# Patient Record
Sex: Female | Born: 1970 | Hispanic: Yes | Marital: Married | Smoking: Never smoker
Health system: Southern US, Community
[De-identification: ages and names within clinical notes are randomized; demographics above are authoritative.]

## PROBLEM LIST (undated history)

## (undated) DIAGNOSIS — I10 Essential (primary) hypertension: Secondary | ICD-10-CM

## (undated) DIAGNOSIS — J45909 Unspecified asthma, uncomplicated: Secondary | ICD-10-CM

## (undated) DIAGNOSIS — K219 Gastro-esophageal reflux disease without esophagitis: Secondary | ICD-10-CM

## (undated) DIAGNOSIS — E785 Hyperlipidemia, unspecified: Secondary | ICD-10-CM

## (undated) HISTORY — DX: Unspecified asthma, uncomplicated: J45.909

## (undated) HISTORY — DX: Gastro-esophageal reflux disease without esophagitis: K21.9

## (undated) HISTORY — DX: Hyperlipidemia, unspecified: E78.5

## (undated) HISTORY — DX: Essential (primary) hypertension: I10

---

## 2012-03-28 ENCOUNTER — Other Ambulatory Visit (HOSPITAL_COMMUNITY): Payer: Self-pay | Admitting: *Deleted

## 2012-03-28 DIAGNOSIS — Z1231 Encounter for screening mammogram for malignant neoplasm of breast: Secondary | ICD-10-CM

## 2012-05-05 ENCOUNTER — Ambulatory Visit (HOSPITAL_COMMUNITY): Payer: Self-pay

## 2012-05-29 ENCOUNTER — Ambulatory Visit (HOSPITAL_COMMUNITY)
Admission: RE | Admit: 2012-05-29 | Discharge: 2012-05-29 | Disposition: A | Discharge status: Home / Self Care | Payer: Self-pay | Source: Ambulatory Visit | Attending: *Deleted | Admitting: *Deleted

## 2012-05-29 DIAGNOSIS — Z1231 Encounter for screening mammogram for malignant neoplasm of breast: Secondary | ICD-10-CM

## 2012-05-29 IMAGING — MG MM DIGITAL SCREENING BILAT W/ CAD
5 series · 5 of 5 positions shown · non-contrast
Comparison: none

CLINICAL DATA: Screening.

MAMMOGRAPHIC BILATERAL DIGITAL SCREENING WITH CAD

[R CC]
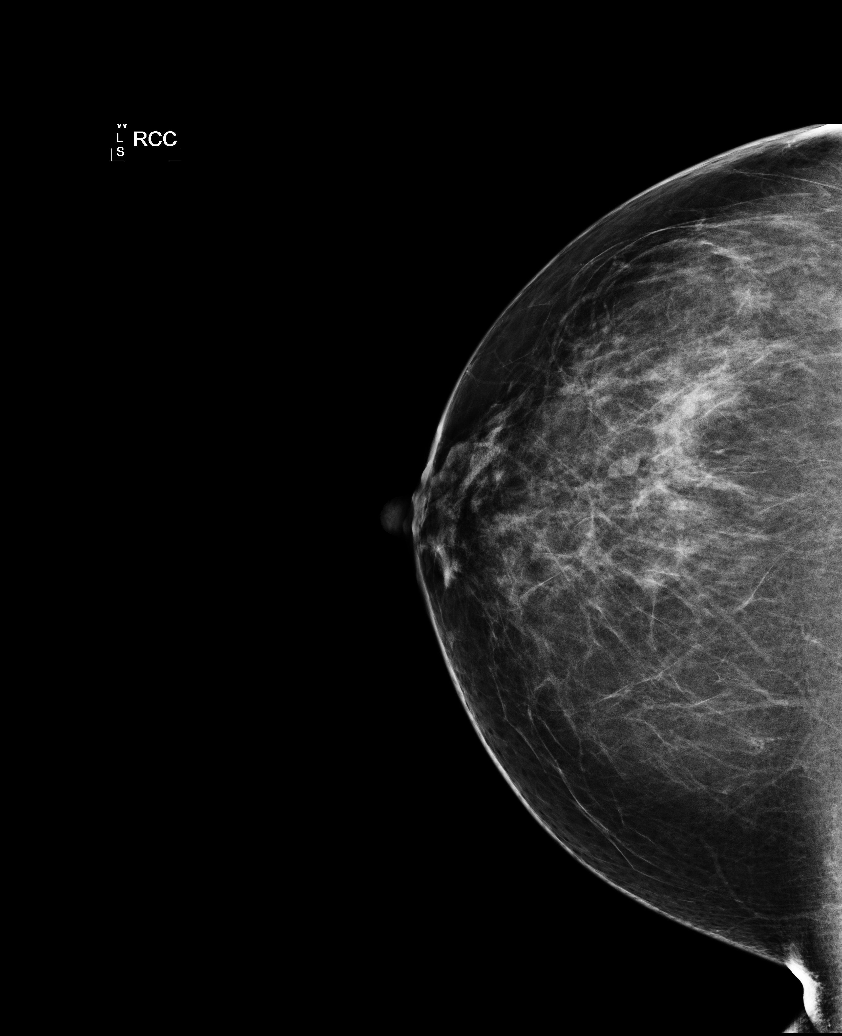

[R MLO]
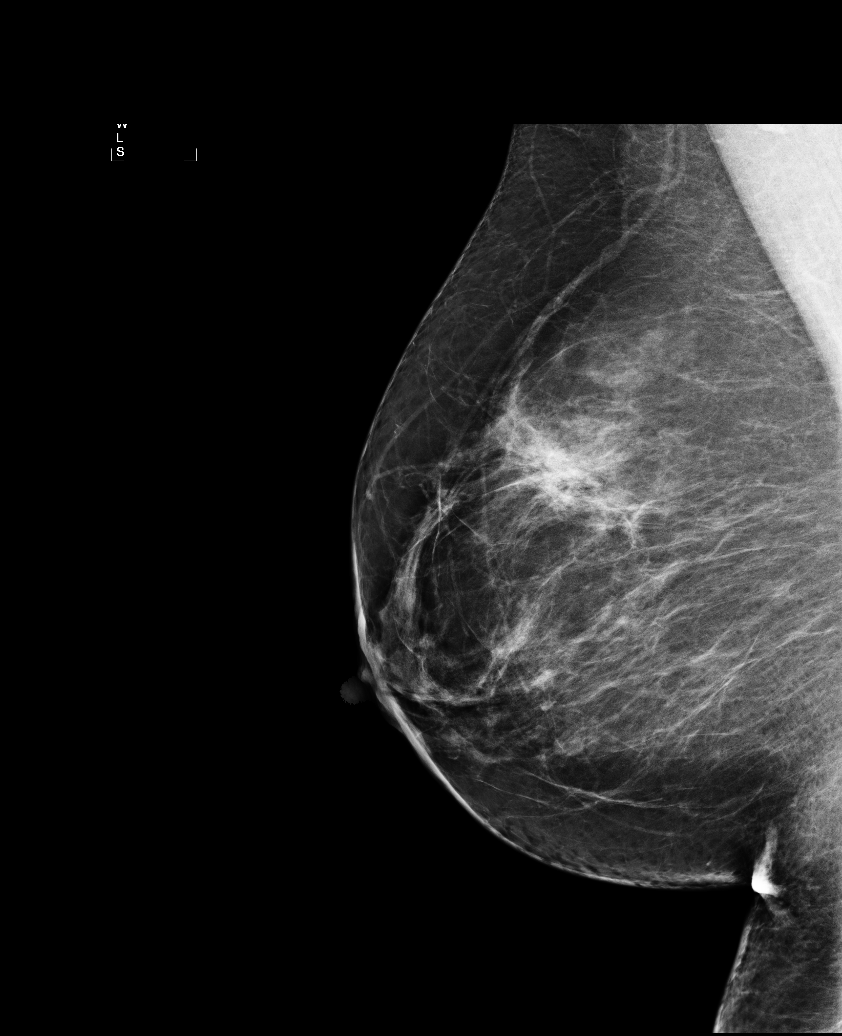

[L CC (1 of 2)]
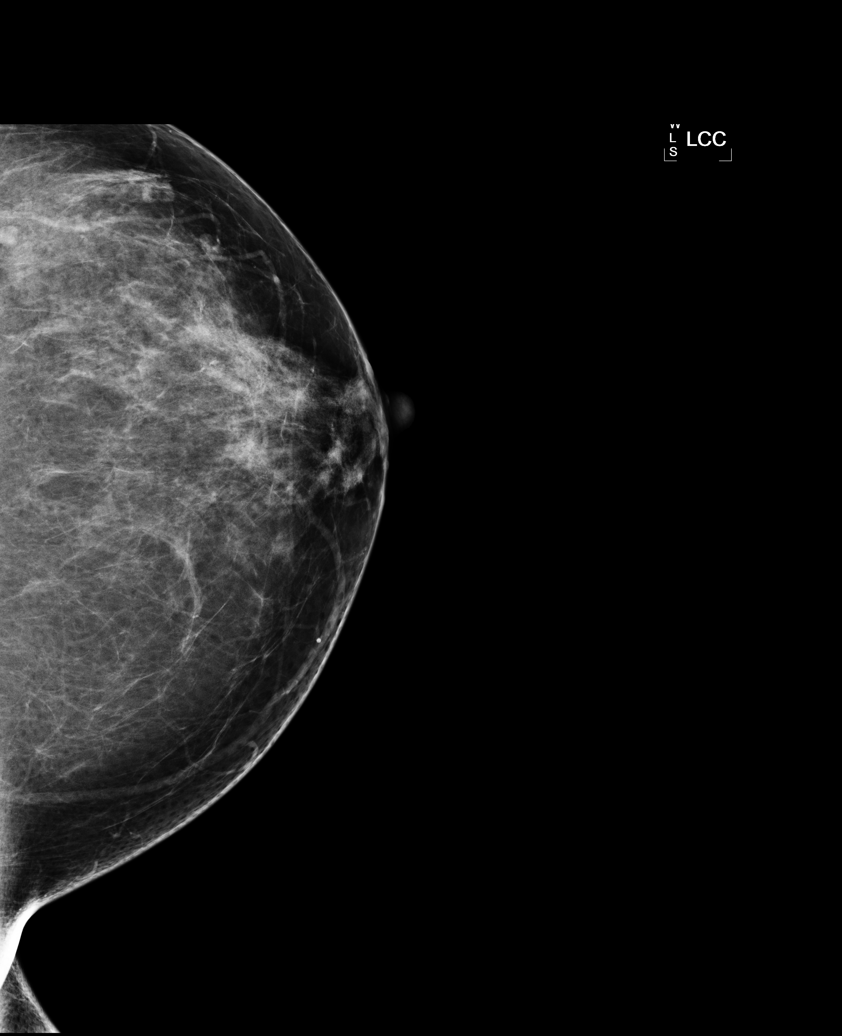

[L MLO]
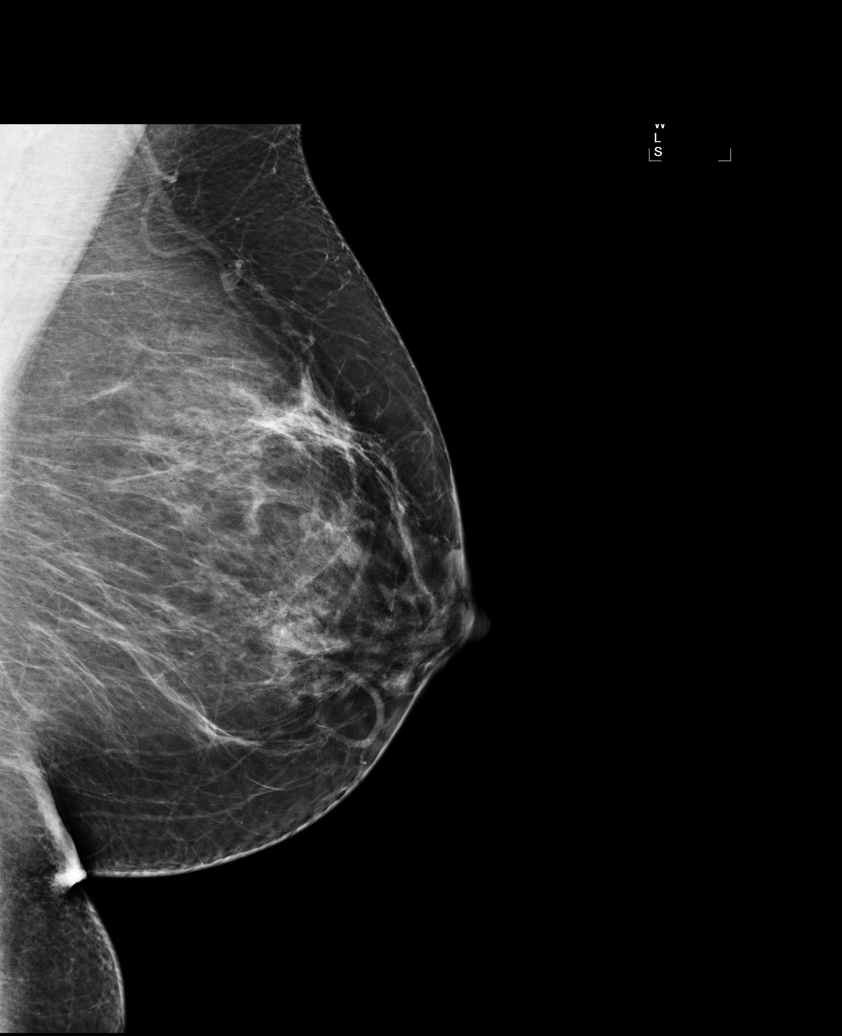

[L CC (2 of 2)]
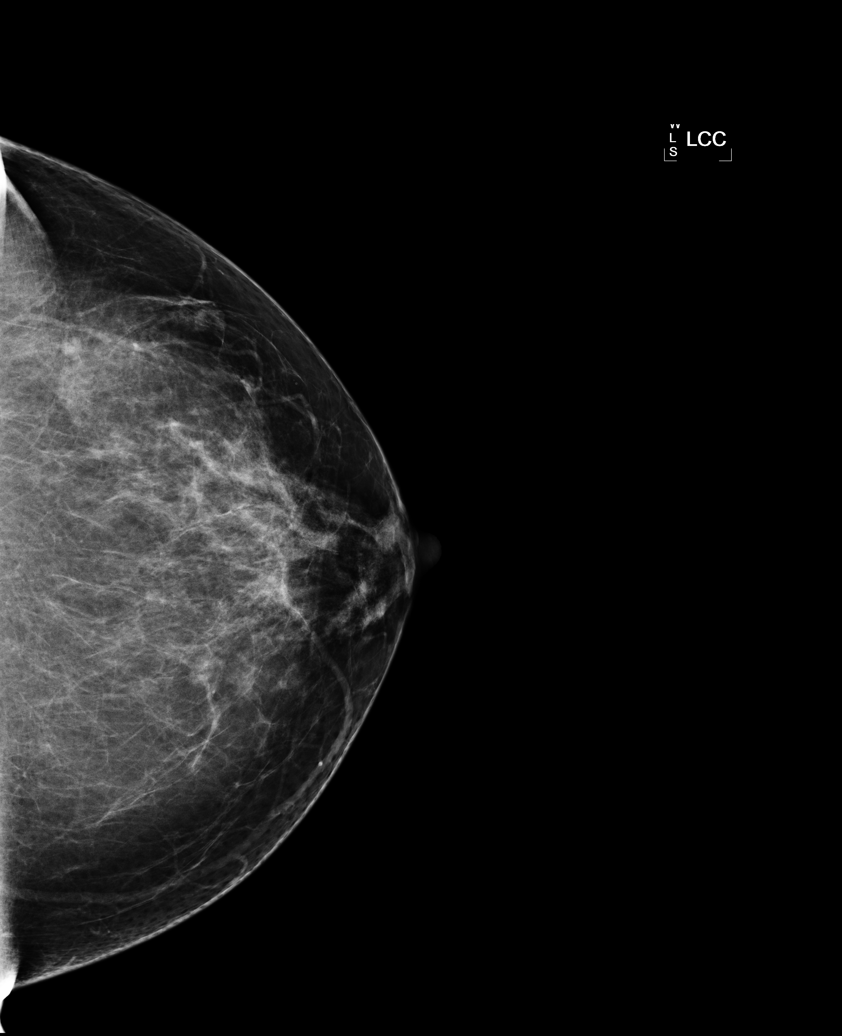

[5 of 5 positions shown; findings below may reference images not displayed]

FINDINGS: Two views of each breast demonstrate scattered
fibroglandular densities. In the right breast, a possible asymmetry
warrants further evaluation with spot compression views and
possibly ultrasound.  In the left breast, no asymmetries or
malignant type calcifications are identified.

Images were processed with CAD.
IMPRESSION: Further evaluation is suggested for possible asymmetry in the right
breast.

RECOMMENDATION:
Diagnostic mammogram and possibly ultrasound of the right breast.
(Code:S2-7-77I)

BI-RADS CATEGORY 0:  Incomplete.  Need additional imaging
evaluation and/or prior mammograms for comparison.

## 2012-06-04 ENCOUNTER — Other Ambulatory Visit: Payer: Self-pay | Admitting: *Deleted

## 2012-06-04 ENCOUNTER — Other Ambulatory Visit: Payer: Self-pay | Admitting: Unknown Physician Specialty

## 2012-06-04 DIAGNOSIS — R928 Other abnormal and inconclusive findings on diagnostic imaging of breast: Secondary | ICD-10-CM

## 2012-06-11 ENCOUNTER — Ambulatory Visit
Admission: RE | Admit: 2012-06-11 | Discharge: 2012-06-11 | Disposition: A | Discharge status: Home / Self Care | Payer: No Typology Code available for payment source | Source: Ambulatory Visit | Attending: *Deleted | Admitting: *Deleted

## 2012-06-11 DIAGNOSIS — R928 Other abnormal and inconclusive findings on diagnostic imaging of breast: Secondary | ICD-10-CM

## 2012-06-11 IMAGING — US US BREAST*R*
1 series · 4 of 4 positions shown · non-contrast
Comparison: None.

CLINICAL DATA: The patient returns for evaluation of a possible
asymmetry in the right breast noted on recent screening mammogram
dated 05/29/2012.

DIGITAL DIAGNOSTIC RIGHT LIMITED MAMMOGRAM  AND RIGHT BREAST
ULTRASOUND:

[Series 1: us breast*right* · 4 of 4 slices shown]
[im 1/4]
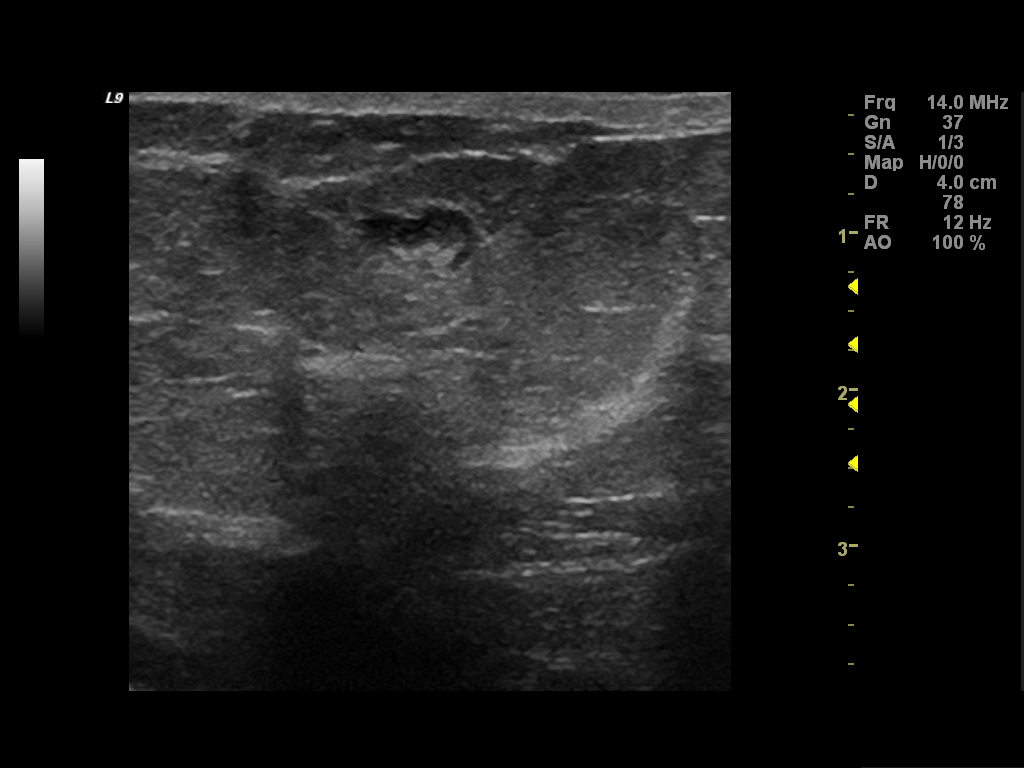
[im 2/4]
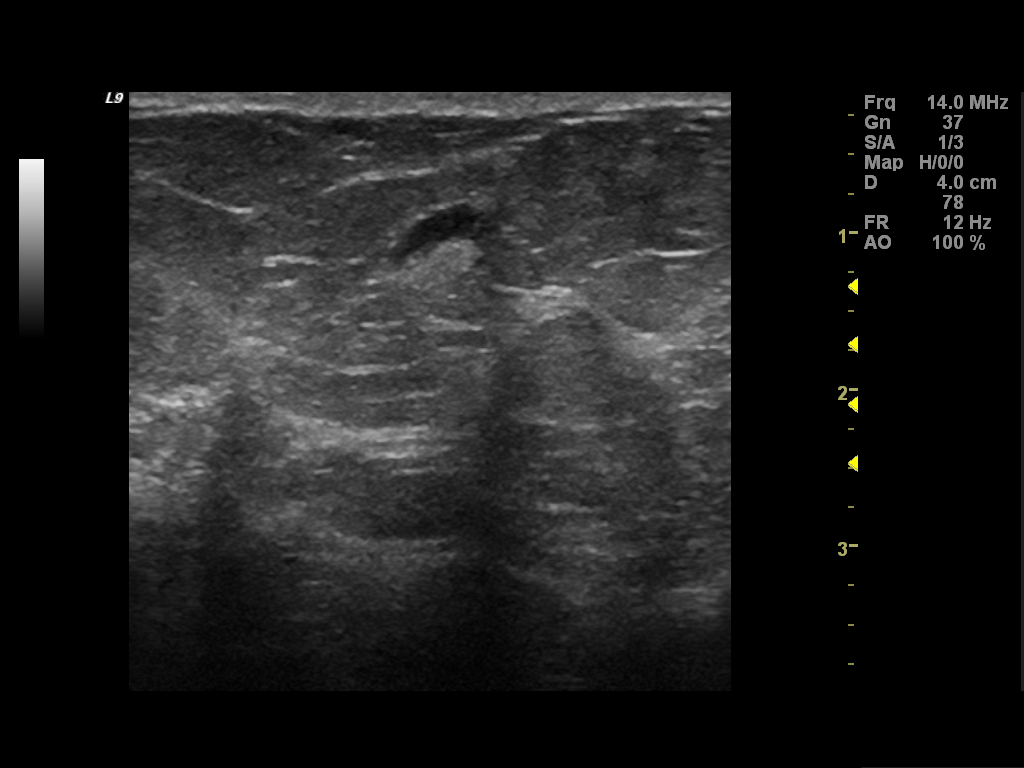
[im 3/4]
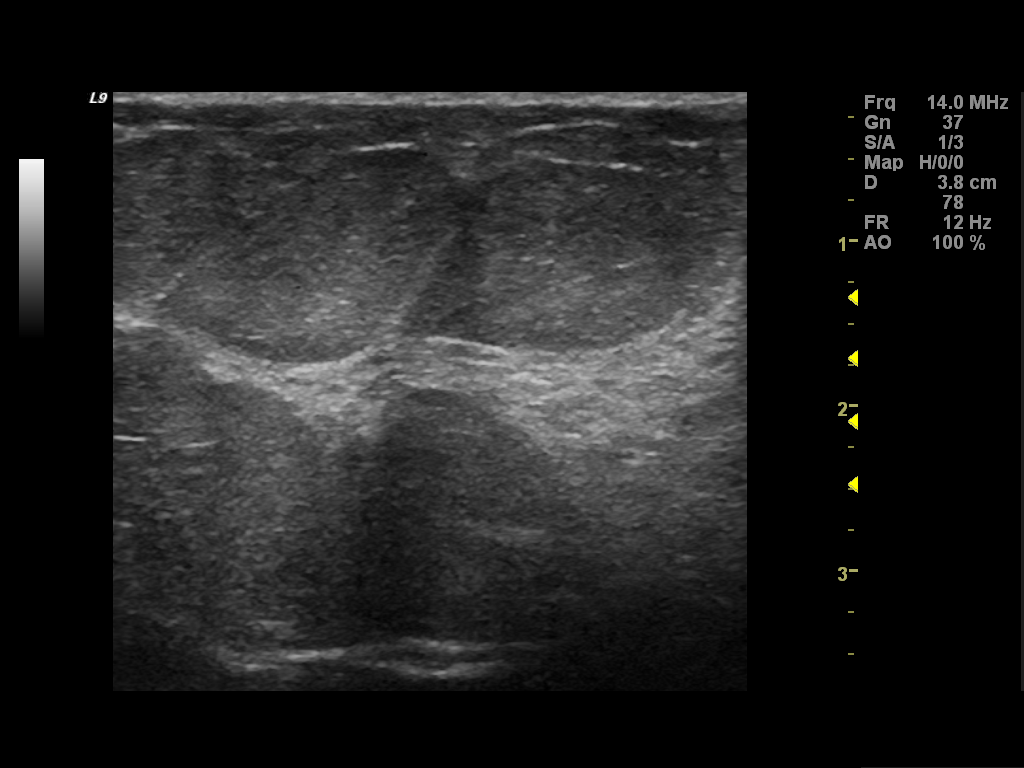
[im 4/4]
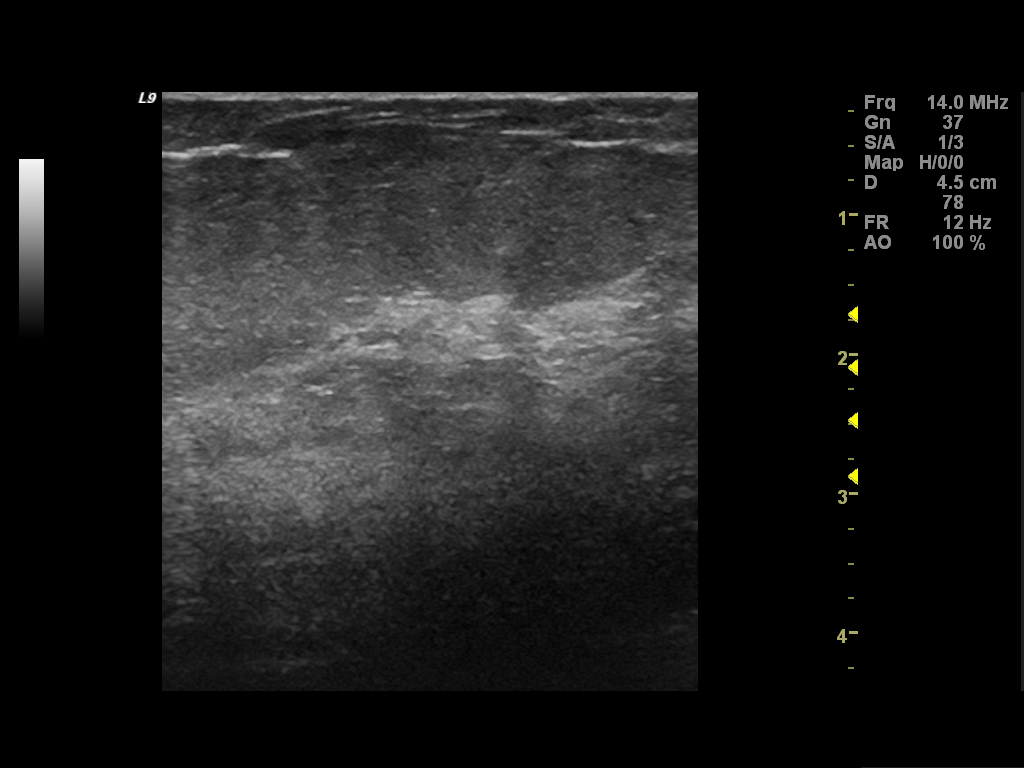

[4 of 4 positions shown; findings below may reference images not displayed]

FINDINGS: Additional views the right upper outer quadrant
demonstrate no persistent mass or distortion.  Additional views of
the right lower outer quadrant demonstrate a circumscribed nodule
consistent with a lymph node.
Mammographic images were processed with CAD.

On physical exam, no mass is palpated in the outer half of the
right breast.

Ultrasound is performed, showing an intramammary lymph node at 7
o'clock, 6 cm from the right nipple.  In the right upper outer
quadrant there is mixed fibroglandular tissue and fat with no mass,
distortion or shadowing to suggest malignancy.
IMPRESSION: No mammographic or sonographic evidence of malignancy.

RECOMMENDATION:
Yearly screening mammography is suggested.

BI-RADS CATEGORY 2:  Benign finding(s).

## 2012-06-12 IMAGING — MG MM DIGITAL DIAGNOSTIC LIMITED*R*
4 series · 4 of 4 positions shown · non-contrast
Comparison: None.

CLINICAL DATA: The patient returns for evaluation of a possible
asymmetry in the right breast noted on recent screening mammogram
dated 05/29/2012.

DIGITAL DIAGNOSTIC RIGHT LIMITED MAMMOGRAM  AND RIGHT BREAST
ULTRASOUND:

[R CC]
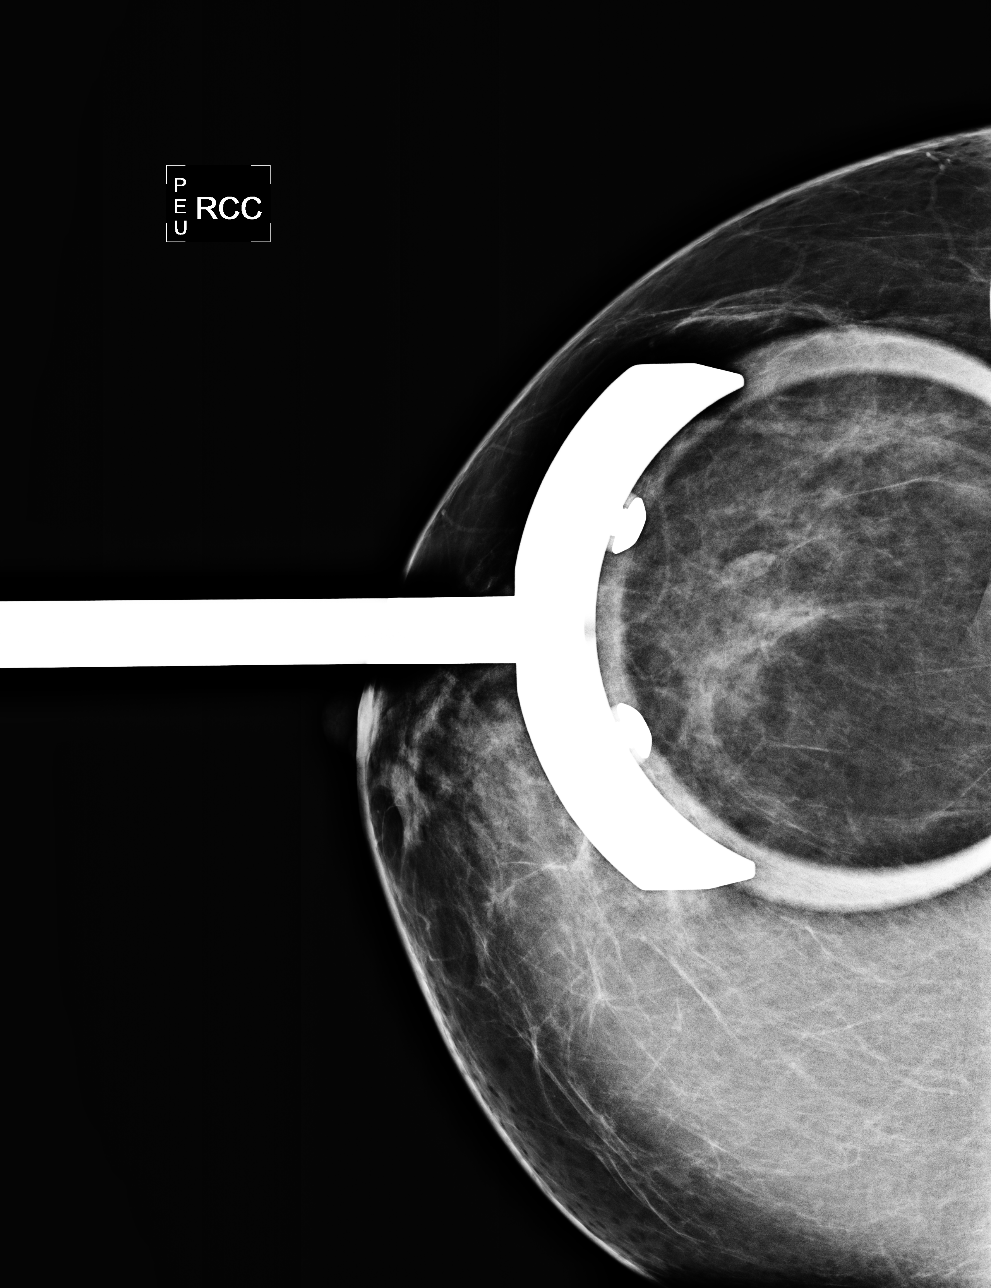

[R MLO (1 of 3)]
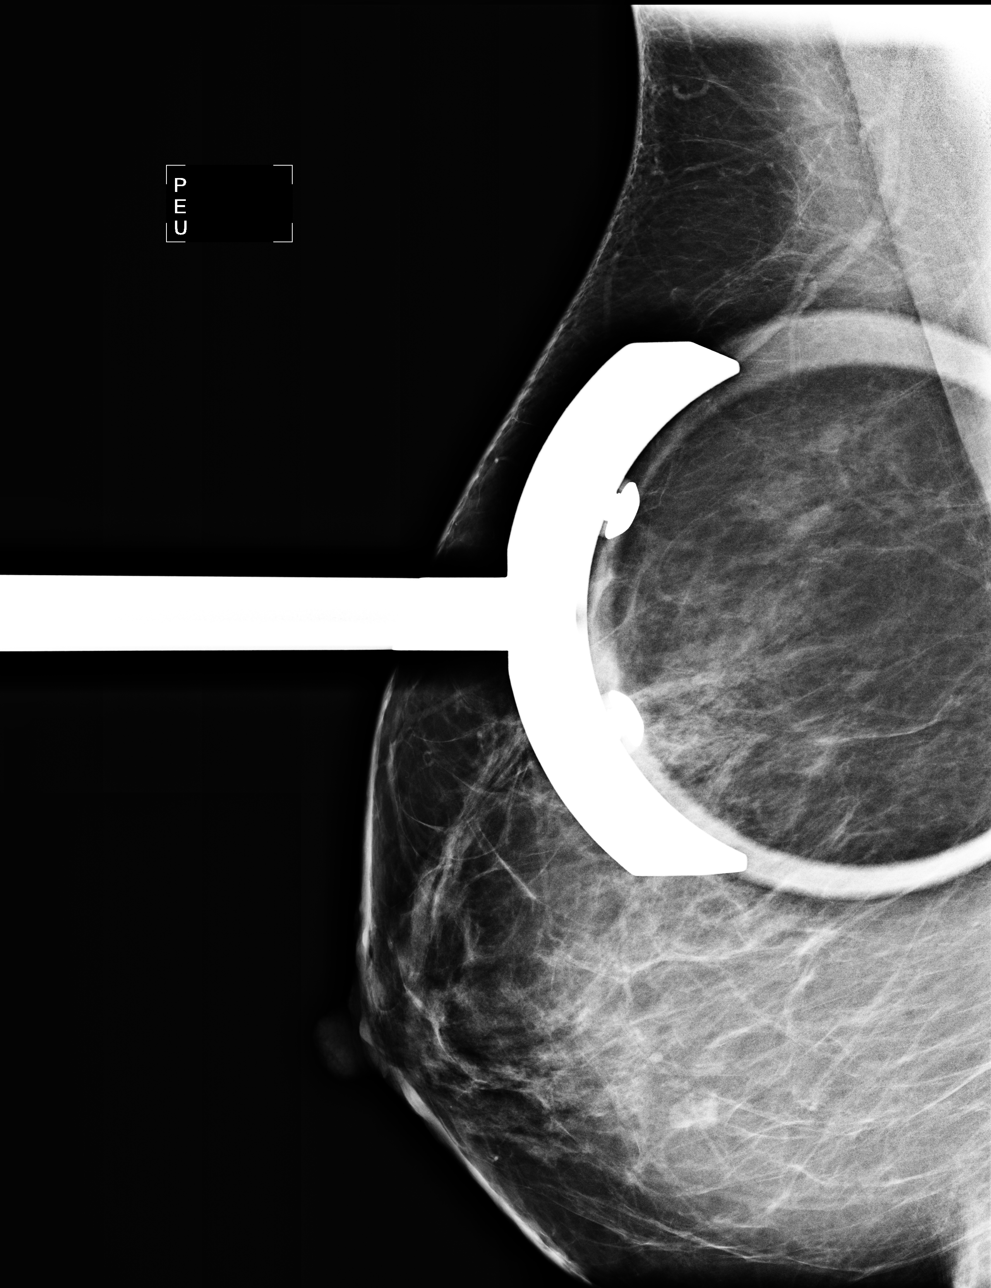

[R MLO (2 of 3)]
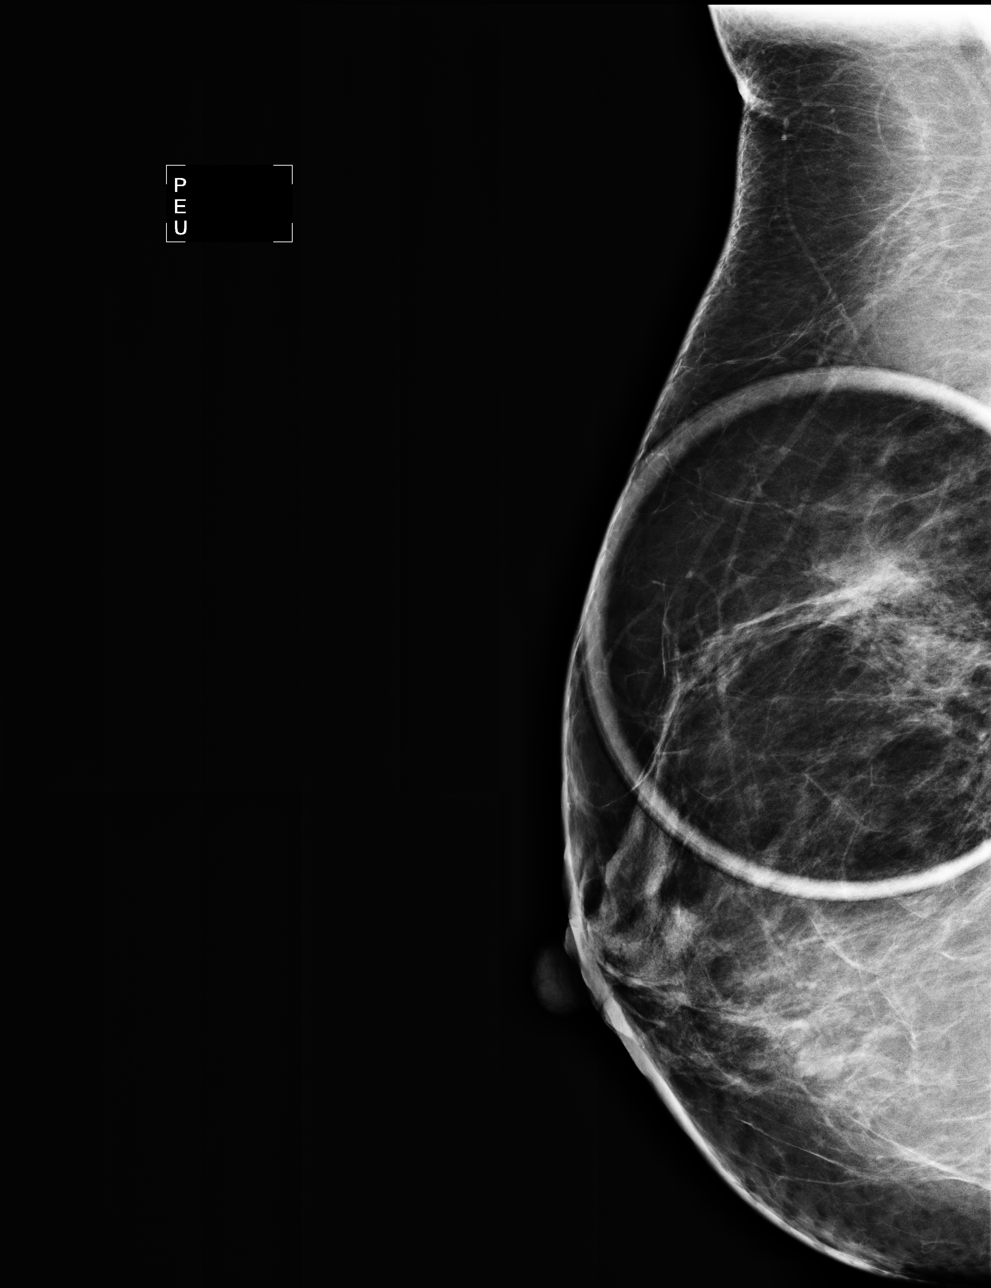

[R MLO (3 of 3)]
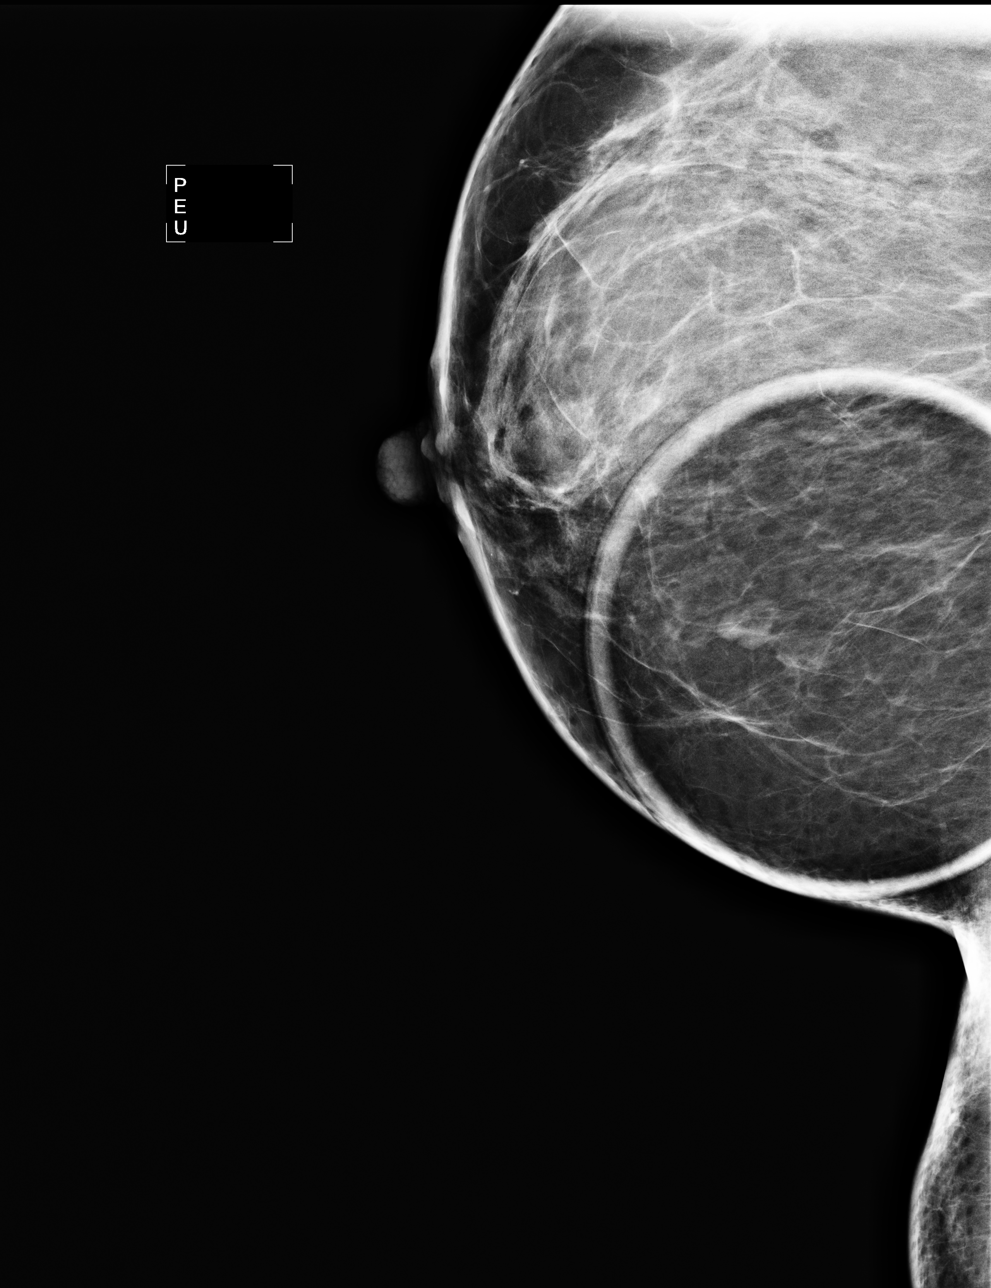

[4 of 4 positions shown; findings below may reference images not displayed]

FINDINGS: Additional views the right upper outer quadrant
demonstrate no persistent mass or distortion.  Additional views of
the right lower outer quadrant demonstrate a circumscribed nodule
consistent with a lymph node.
Mammographic images were processed with CAD.

On physical exam, no mass is palpated in the outer half of the
right breast.

Ultrasound is performed, showing an intramammary lymph node at 7
o'clock, 6 cm from the right nipple.  In the right upper outer
quadrant there is mixed fibroglandular tissue and fat with no mass,
distortion or shadowing to suggest malignancy.
IMPRESSION: No mammographic or sonographic evidence of malignancy.

RECOMMENDATION:
Yearly screening mammography is suggested.

BI-RADS CATEGORY 2:  Benign finding(s).

## 2013-06-10 ENCOUNTER — Other Ambulatory Visit (HOSPITAL_COMMUNITY): Payer: Self-pay | Admitting: Unknown Physician Specialty

## 2013-06-10 DIAGNOSIS — Z1231 Encounter for screening mammogram for malignant neoplasm of breast: Secondary | ICD-10-CM

## 2013-06-18 ENCOUNTER — Ambulatory Visit (HOSPITAL_COMMUNITY)
Admission: RE | Admit: 2013-06-18 | Discharge: 2013-06-18 | Disposition: A | Discharge status: Home / Self Care | Payer: Self-pay | Source: Ambulatory Visit | Attending: Unknown Physician Specialty | Admitting: Unknown Physician Specialty

## 2013-06-18 DIAGNOSIS — Z1231 Encounter for screening mammogram for malignant neoplasm of breast: Secondary | ICD-10-CM

## 2013-06-18 IMAGING — MG MM DIGITAL SCREENING BILAT W/ CAD
4 series · 4 of 4 positions shown · non-contrast
Comparison: 05/29/2012

CLINICAL DATA: Screening.

DIGITAL SCREENING BILATERAL MAMMOGRAM WITH CAD

[R CC]
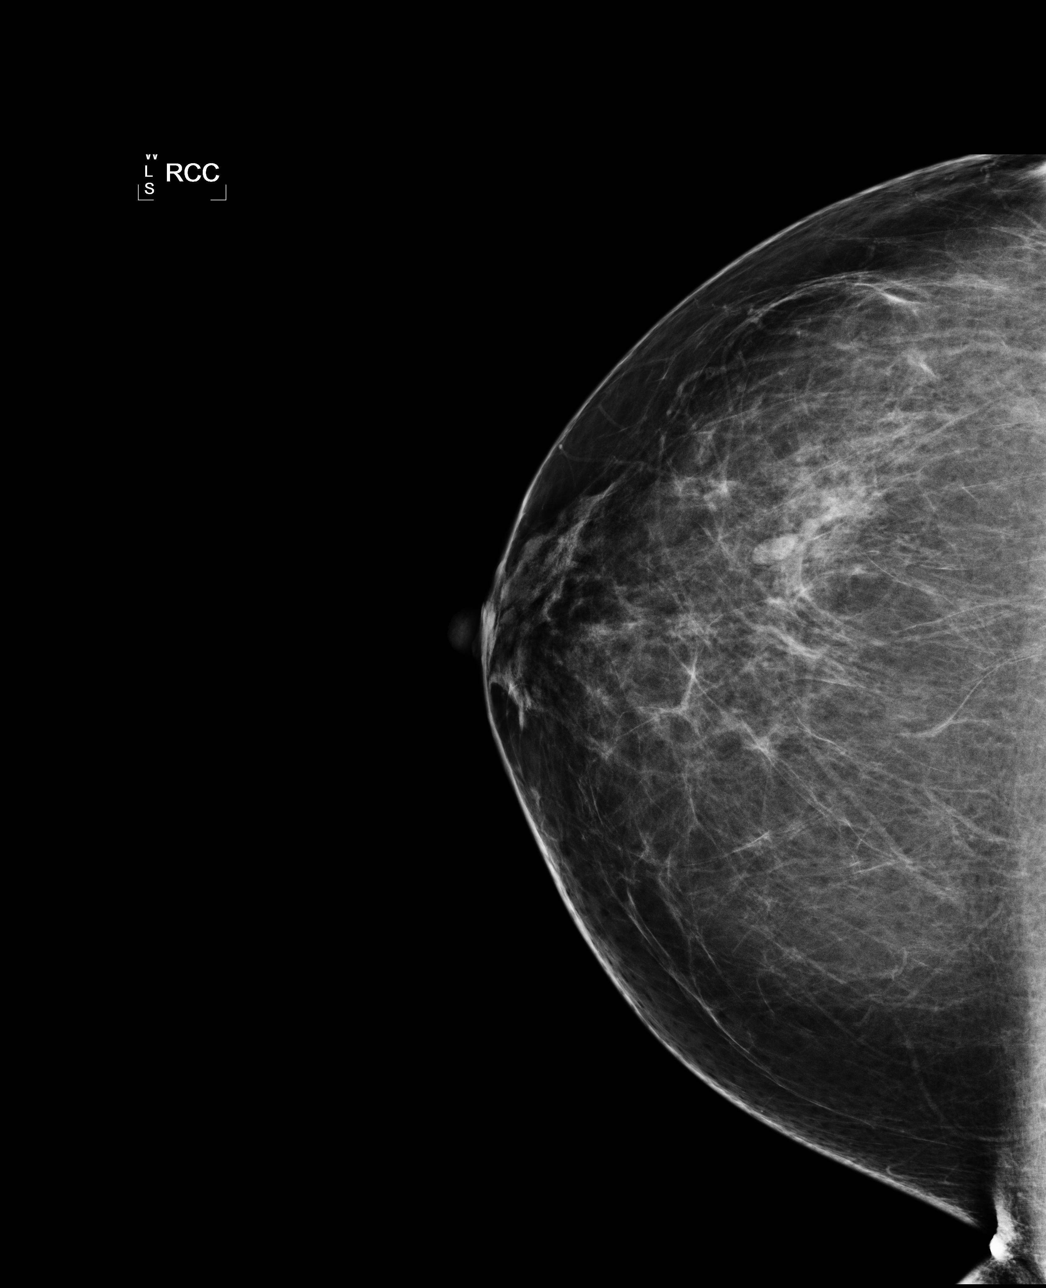

[R MLO]
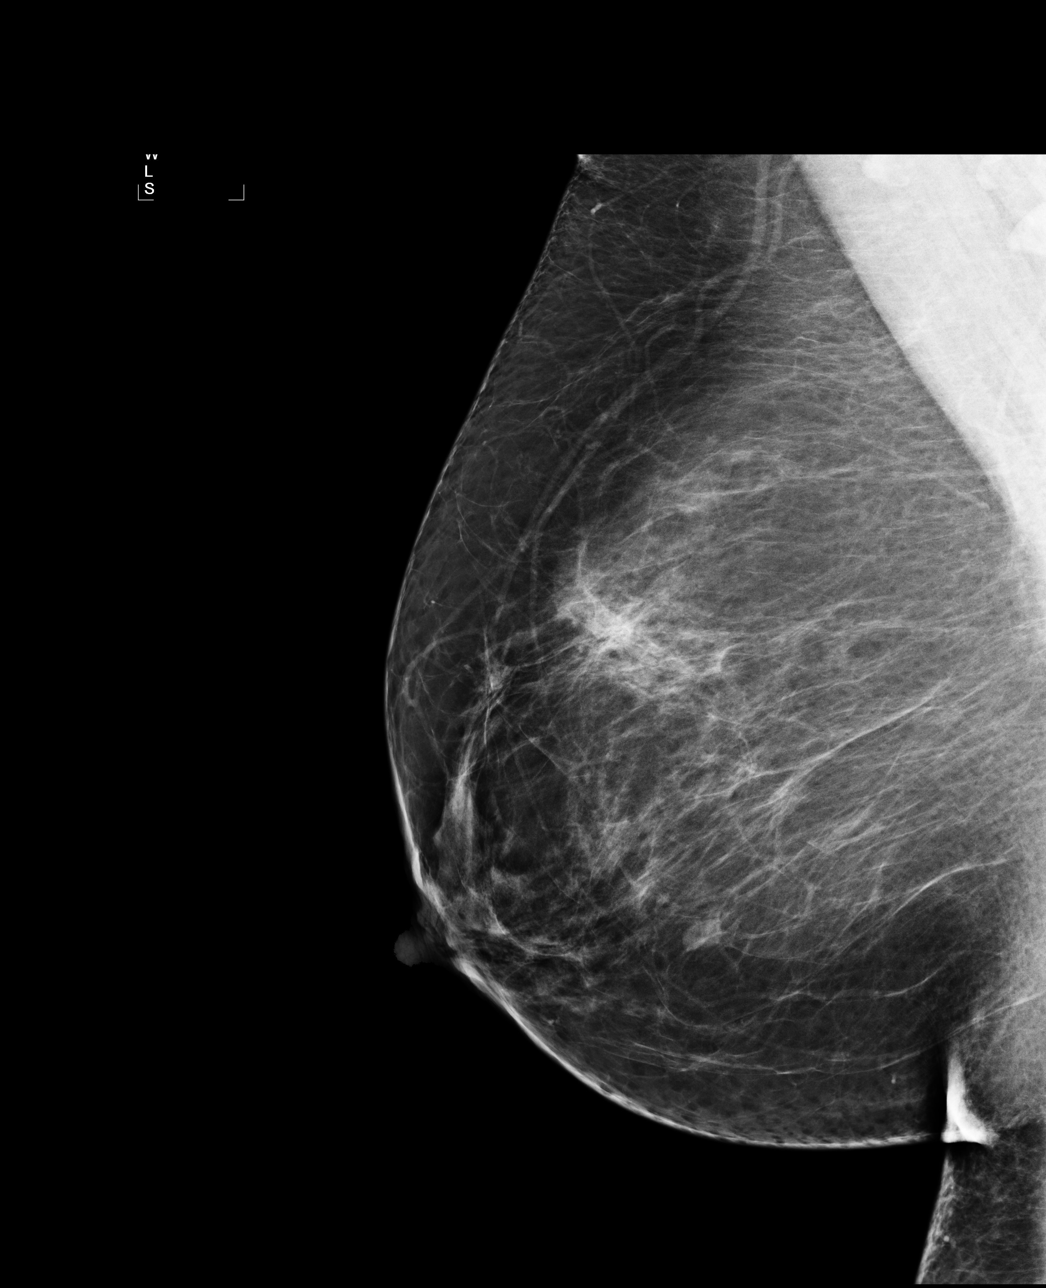

[L CC]
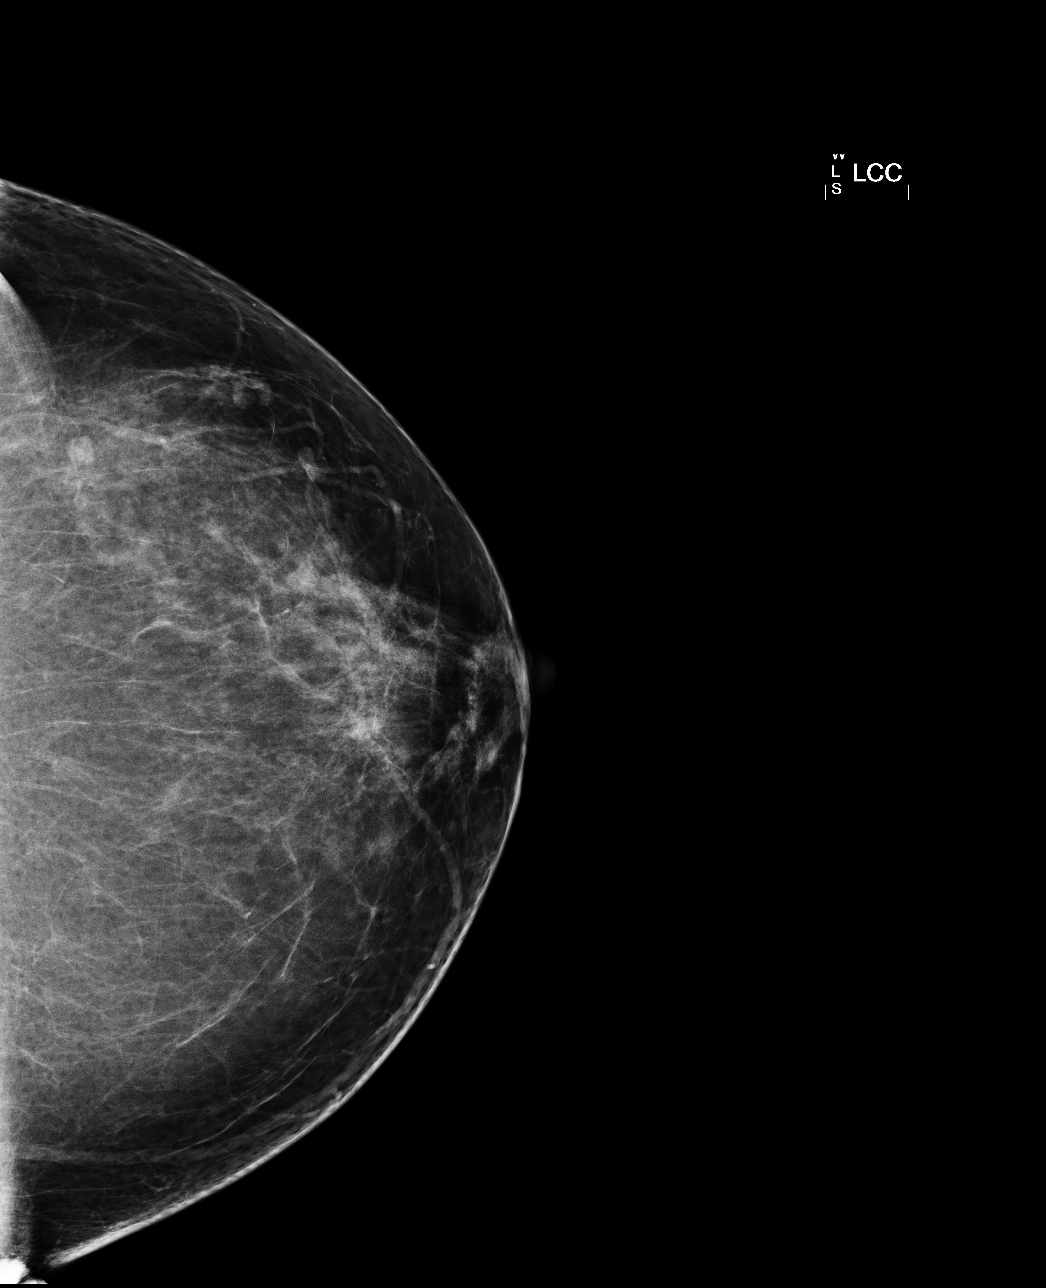

[L MLO]
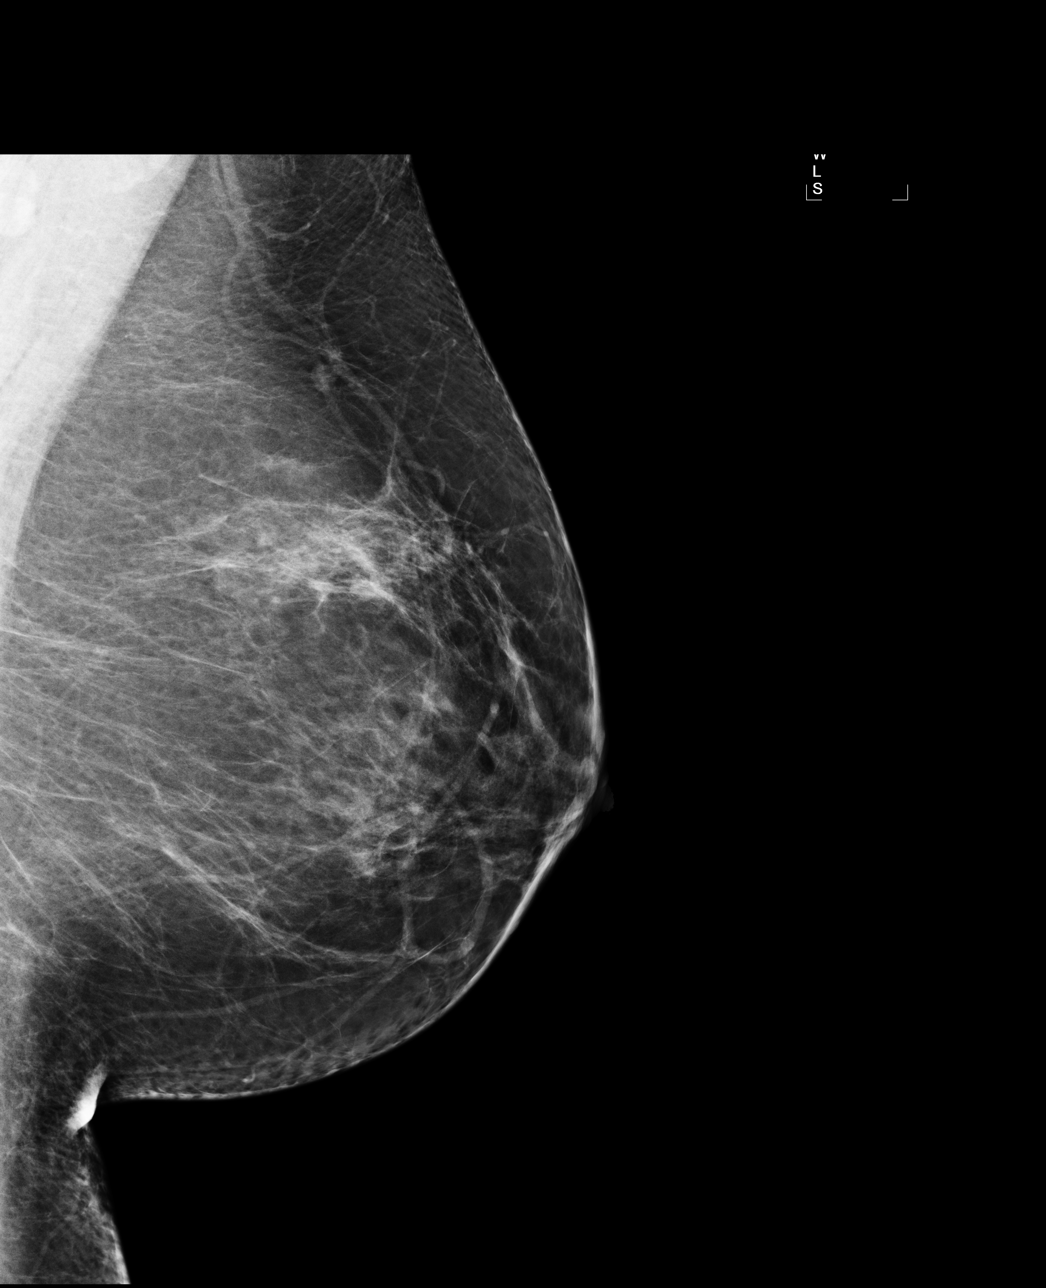

[4 of 4 positions shown; findings below may reference images not displayed]

FINDINGS: ACR Breast Density Category b: There are scattered areas of
fibroglandular density.

There is no suspicious dominant mass, architectural distortion, or
calcification to suggest malignancy.

Images were processed with CAD.
IMPRESSION: No mammographic evidence of malignancy.

A result letter of this screening mammogram will be mailed directly
to the patient.

RECOMMENDATION:
Screening mammogram in one year. (Code:EV-S-TAV)

BI-RADS CATEGORY 1:  Negative.

## 2013-11-26 ENCOUNTER — Encounter: Payer: Self-pay | Admitting: *Deleted

## 2013-12-28 ENCOUNTER — Encounter: Payer: No Typology Code available for payment source | Admitting: Obstetrics & Gynecology

## 2014-02-01 ENCOUNTER — Ambulatory Visit (INDEPENDENT_AMBULATORY_CARE_PROVIDER_SITE_OTHER): Payer: Self-pay | Admitting: Obstetrics & Gynecology

## 2014-02-01 ENCOUNTER — Encounter: Payer: Self-pay | Admitting: Obstetrics & Gynecology

## 2014-02-01 VITALS — BP 130/91 | HR 83 | Temp 97.5°F | Ht 62.0 in | Wt 223.6 lb

## 2014-02-01 DIAGNOSIS — N898 Other specified noninflammatory disorders of vagina: Secondary | ICD-10-CM

## 2014-02-01 NOTE — Progress Notes (Signed)
Subjective:     Patient ID: Robin Webb, female   DOB: 15-Jun-1971, 43 y.o.   MRN: [id number]  HPI Pt presents as a referral from the [facility] for eval of vaginal wall cyst.  She denies complaints or other sx.  She is not sexually active often but, reports that it is not related to the cyst.  Past Medical History  Diagnosis Date  . Hyperlipidemia   . Hypertension   . GERD (gastroesophageal reflux disease)   . Asthma   History reviewed. No pertinent past surgical history. No current outpatient prescriptions on file prior to visit.   No current facility-administered medications on file prior to visit.   No Known Allergies Family History  Problem Relation Age of Onset  . Cancer Father   . Diabetes Brother    History  Substance Use Topics  . Smoking status: Never Smoker   . Smokeless tobacco: Never Used  . Alcohol Use: No     Review of Systems     Objective:   Physical Exam BP 130/91  Pulse 83  Temp(Src) 97.5 F (36.4 C) (Oral)  Ht 5\' 2"  (1.575 m)  Wt 223 lb 9.6 oz (101.424 kg)  BMI 40.89 kg/m2  LMP 12/26/2013 GU: EGBUS: no lesions Vagina: no blood in vault; simple cyst on left vaginal sidewall- benign. Cervix: no lesion; no mucopurulent d/c Uterus: small, mobile Adnexa: no masses; non tender         Assessment:     Vaginal wall cyst- benign and asymptomatic     Plan:     F/u prn

## 2014-02-01 NOTE — Patient Instructions (Signed)
Quiste epidérmico   (Epidermal Cyst)  Un quiste epidérmico se denomina también quiste sebáceo, quiste de inclusión epidérmica o quiste infundibular. Estos quistes contienen una sustancia "pastosa" o similar al "queso" y puede tener mal olor. Esta sustancia es una proteína denominada keratina. Estos quistes generalmente se forman en el rostro, el cuello o el tronco. También pueden aparecer en la zona vaginal u otras partes de los genitales, tanto en hombres como en mujeres. En general son pequeños bultos indoloros, que crecen lentamente y que se mueven libremente debajo de la piel. Es importante no tratar de apretarlos para extraer la sustancia que contienen. Esto puede ocasionar una infección que origine dolor e hinchazón en el área.   CAUSAS   La causa del puede ser una lesión penetrante profunda o un folículo piloso obstruido, generalmente asociado al acné.   SÍNTOMAS   Los quistes epidermicos pueden inflamarse y causar:   · Enrojecimiento.  · Sensibilidad.  · Aumento de la temperatura en la zona.  · Material que drena de color blanco grisáceo, consistente y de olor desagradable.  DIAGNÓSTICO  Generalmente estas infecciones son diagnosticadas por el profesional durante el examen físico. En raras ocasiones será necesario realizar una biopsia para descartar otros trastornos que parezcan ser similares.   TRATAMIENTO  · Generalmente mejoran y desaparecen sin tratamiento. No suelen ser peligrosos.  · Pueden inflamarse y sensibilizarse si se infectan. Esto puede requerir la apertura y drenaje del quiste. Podrá ser necesaria la administración de antibióticos. Cuando la infección haya desaparecido, el quiste podrá eliminarse con una cirugía menor.  · Los pequeños quistes inflamados generalmente pueden tratarse inyectado corticoides con los antibióticos.  · En algunos casos el quiste se agranda y puede ser una preocupación. Si esto ocurre, es necesario extirparlo quirúrgicamente en el consultorio del  profesional.  INSTRUCCIONES PARA EL CUIDADO EN EL HOGAR   · Tome sólo medicamentos de venta libre o recetados, según las indicaciones del médico.  · Tome los antibióticos como se le indicó. Tómelos todos, aunque se sienta mejor.  SOLICITE ATENCIÓN MÉDICA SI:   · Siente dolor, observa enrojecimiento o hinchazón.  · El problema no mejora, o empeora.  · Tiene preguntas o preocupaciones.  ASEGÚRESE DE QUE:   · Comprende estas instrucciones.  · Controlará su enfermedad.  · Solicitará ayuda de inmediato si no mejora o si empeora.  Document Released: 01/21/2007 Document Revised: 03/03/2012  [employer]® Patient Information ©2014 [employer].

## 2014-06-01 ENCOUNTER — Other Ambulatory Visit (HOSPITAL_COMMUNITY): Payer: Self-pay | Admitting: Internal Medicine

## 2014-06-01 DIAGNOSIS — Z1231 Encounter for screening mammogram for malignant neoplasm of breast: Secondary | ICD-10-CM

## 2014-06-21 ENCOUNTER — Ambulatory Visit (HOSPITAL_COMMUNITY)
Admission: RE | Admit: 2014-06-21 | Discharge: 2014-06-21 | Disposition: A | Discharge status: Home / Self Care | Payer: Self-pay | Source: Ambulatory Visit | Attending: Internal Medicine | Admitting: Internal Medicine

## 2014-06-21 DIAGNOSIS — Z1231 Encounter for screening mammogram for malignant neoplasm of breast: Secondary | ICD-10-CM

## 2014-06-21 IMAGING — MG MM DIGITAL SCREENING BILAT W/ CAD
5 series · 5 of 5 positions shown · non-contrast
Comparison: Previous exam(s).

CLINICAL DATA: Screening.

EXAM:
DIGITAL SCREENING BILATERAL MAMMOGRAM WITH CAD

[L CC]
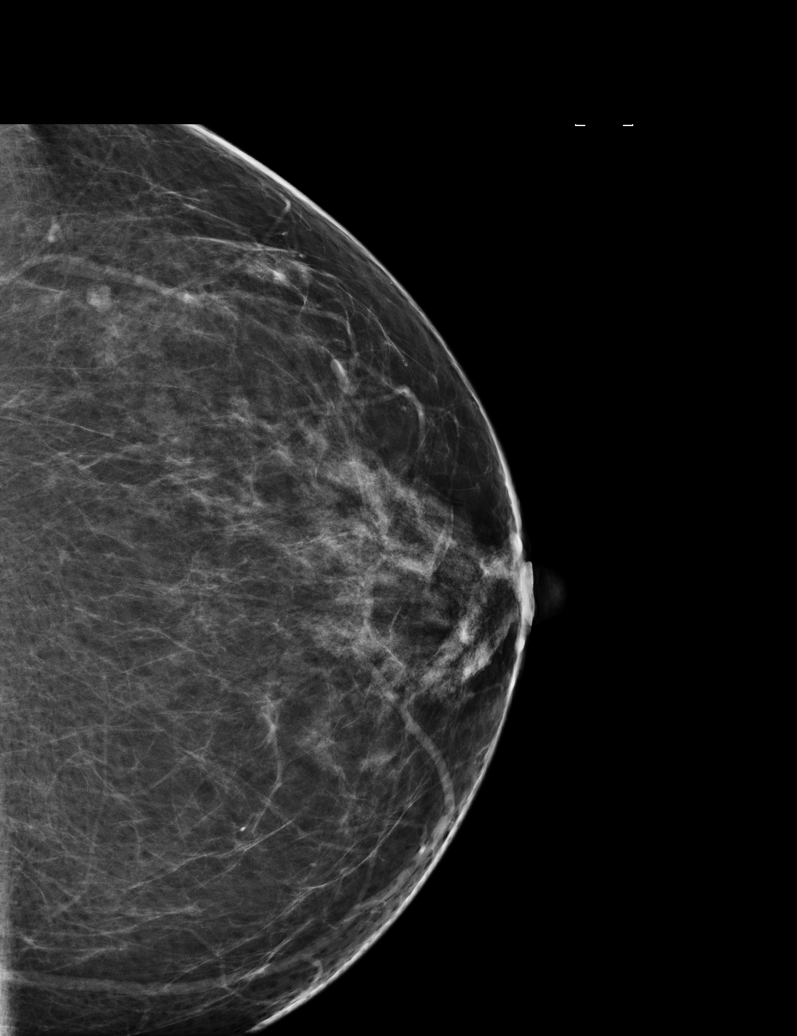

[R MLO]
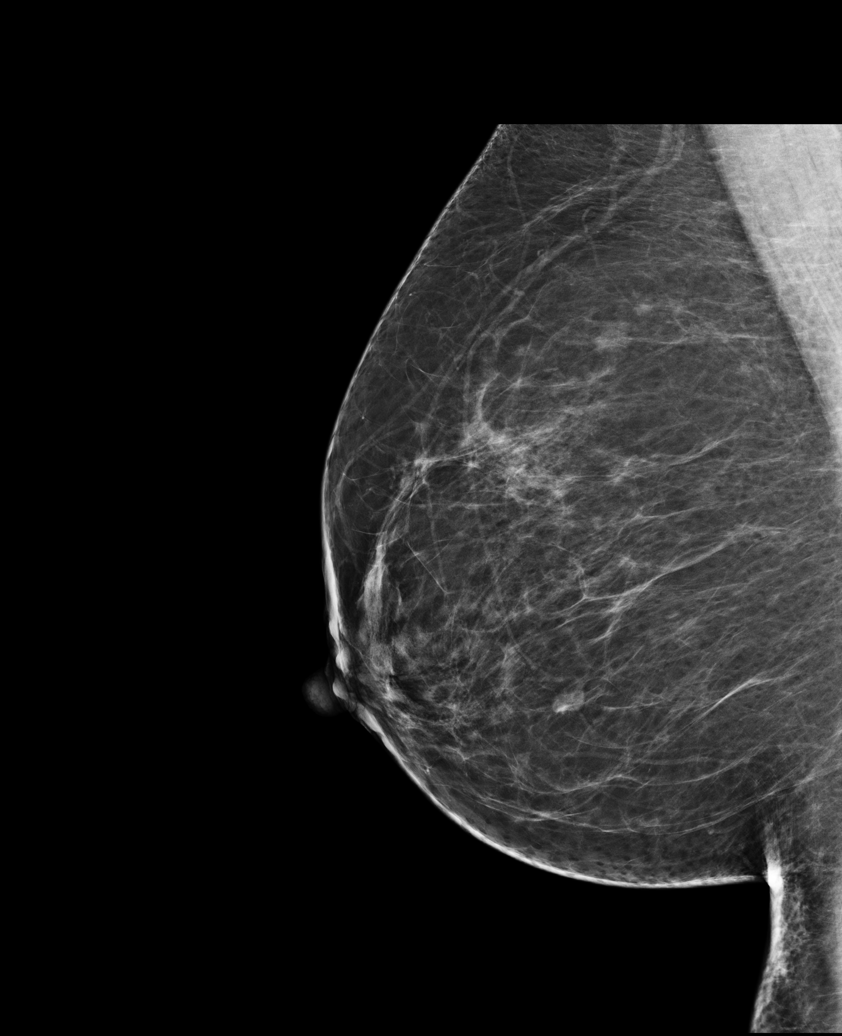

[L MLO]
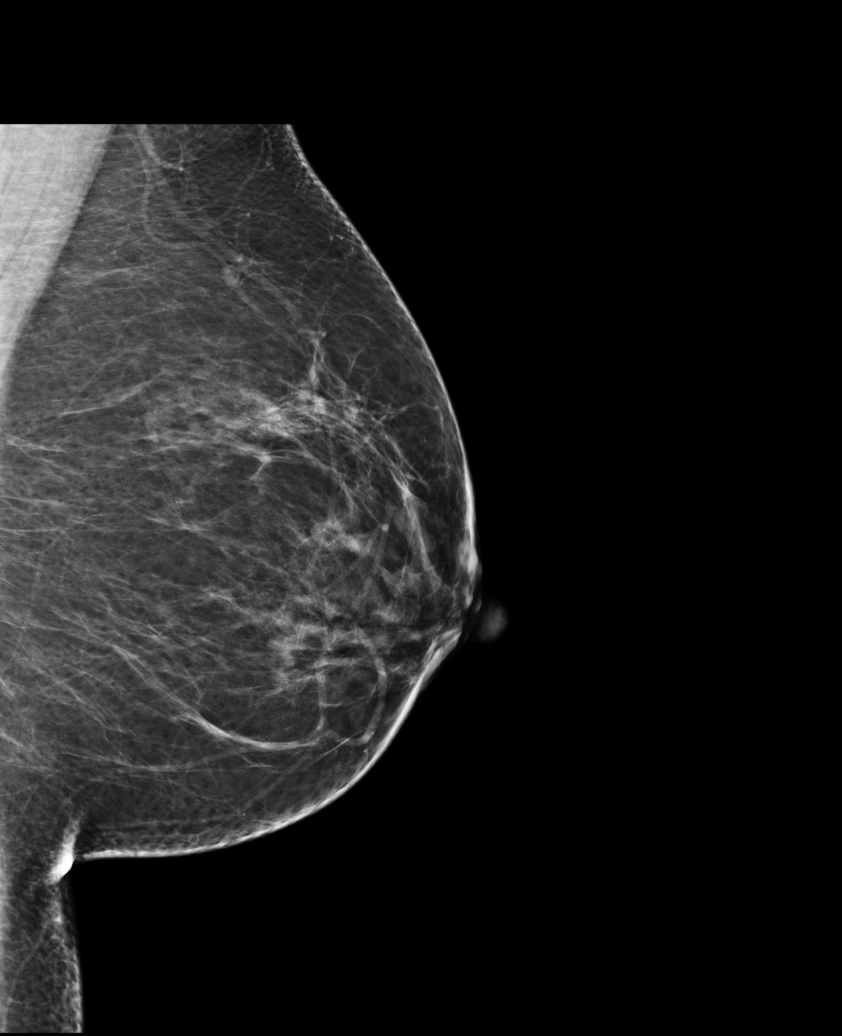

[L CV]
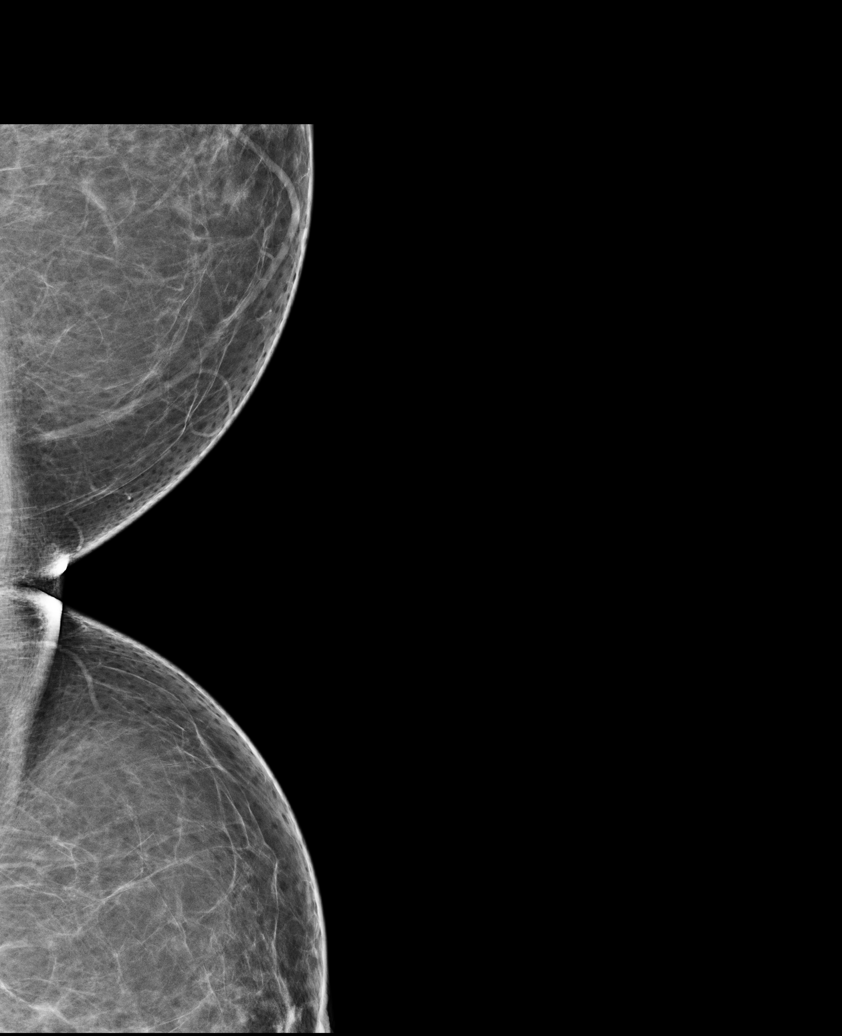

[R CC]
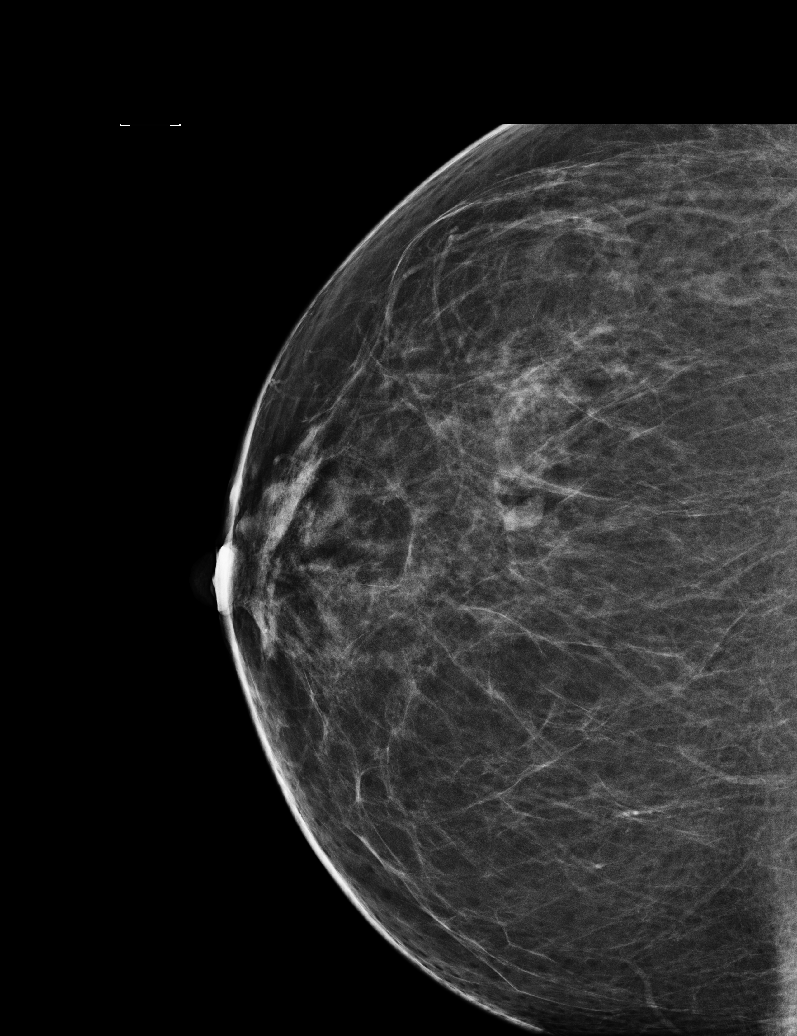

[5 of 5 positions shown; findings below may reference images not displayed]

ACR Breast Density Category b: There are scattered areas of
fibroglandular density.
FINDINGS: There are no findings suspicious for malignancy. Images were
processed with CAD.
IMPRESSION: No mammographic evidence of malignancy. A result letter of this
screening mammogram will be mailed directly to the patient.

RECOMMENDATION:
Screening mammogram in one year. (Code:AS-G-LCT)

BI-RADS CATEGORY  1: Negative.

## 2014-10-25 ENCOUNTER — Encounter: Payer: Self-pay | Admitting: Obstetrics & Gynecology
